# Patient Record
Sex: Female | Born: 1990 | Race: Black or African American | Hispanic: No | Marital: Single | Smoking: Never smoker
Health system: Southern US, Community
[De-identification: ages and names within clinical notes are randomized; demographics above are authoritative.]

## PROBLEM LIST (undated history)

## (undated) DIAGNOSIS — J45909 Unspecified asthma, uncomplicated: Secondary | ICD-10-CM

## (undated) DIAGNOSIS — R011 Cardiac murmur, unspecified: Secondary | ICD-10-CM

## (undated) HISTORY — PX: ABDOMINAL SURGERY: SHX537

---

## 2013-10-01 ENCOUNTER — Emergency Department: Payer: Self-pay | Admitting: Internal Medicine

## 2019-04-04 ENCOUNTER — Emergency Department
Admission: EM | Admit: 2019-04-04 | Discharge: 2019-04-04 | Disposition: A | Discharge status: Home / Self Care | Payer: No Typology Code available for payment source | Attending: Emergency Medicine | Admitting: Emergency Medicine

## 2019-04-04 ENCOUNTER — Other Ambulatory Visit: Payer: Self-pay

## 2019-04-04 ENCOUNTER — Emergency Department: Payer: No Typology Code available for payment source

## 2019-04-04 DIAGNOSIS — S161XXA Strain of muscle, fascia and tendon at neck level, initial encounter: Secondary | ICD-10-CM | POA: Diagnosis not present

## 2019-04-04 DIAGNOSIS — Y929 Unspecified place or not applicable: Secondary | ICD-10-CM | POA: Insufficient documentation

## 2019-04-04 DIAGNOSIS — S199XXA Unspecified injury of neck, initial encounter: Secondary | ICD-10-CM | POA: Diagnosis present

## 2019-04-04 DIAGNOSIS — J45909 Unspecified asthma, uncomplicated: Secondary | ICD-10-CM | POA: Diagnosis not present

## 2019-04-04 DIAGNOSIS — Y999 Unspecified external cause status: Secondary | ICD-10-CM | POA: Diagnosis not present

## 2019-04-04 DIAGNOSIS — Y939 Activity, unspecified: Secondary | ICD-10-CM | POA: Diagnosis not present

## 2019-04-04 HISTORY — DX: Unspecified asthma, uncomplicated: J45.909

## 2019-04-04 MED ORDER — CYCLOBENZAPRINE HCL 5 MG PO TABS: 5.0000 mg | ORAL_TABLET | Freq: Three times a day (TID) | ORAL | 0 refills | Status: DC | PRN

## 2019-04-04 MED ORDER — CYCLOBENZAPRINE HCL 10 MG PO TABS
10.0000 mg | ORAL_TABLET | Freq: Once | ORAL | Status: AC
  Administered 2019-04-04: 10 mg via ORAL
  Filled 2019-04-04: qty 1

## 2019-04-04 MED ORDER — ACETAMINOPHEN 325 MG PO TABS
650.0000 mg | ORAL_TABLET | Freq: Once | ORAL | Status: AC
  Administered 2019-04-04: 650 mg via ORAL
  Filled 2019-04-04: qty 2
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
## 2019-08-31 ENCOUNTER — Encounter: Payer: Self-pay | Admitting: Emergency Medicine

## 2019-08-31 ENCOUNTER — Other Ambulatory Visit: Payer: Self-pay

## 2019-08-31 ENCOUNTER — Emergency Department: Payer: No Typology Code available for payment source

## 2019-08-31 ENCOUNTER — Emergency Department
Admission: EM | Admit: 2019-08-31 | Discharge: 2019-08-31 | Disposition: A | Discharge status: Home / Self Care | Payer: No Typology Code available for payment source | Attending: Emergency Medicine | Admitting: Emergency Medicine

## 2019-08-31 DIAGNOSIS — S60222A Contusion of left hand, initial encounter: Secondary | ICD-10-CM | POA: Diagnosis not present

## 2019-08-31 DIAGNOSIS — Y9289 Other specified places as the place of occurrence of the external cause: Secondary | ICD-10-CM | POA: Insufficient documentation

## 2019-08-31 DIAGNOSIS — Y9389 Activity, other specified: Secondary | ICD-10-CM | POA: Insufficient documentation

## 2019-08-31 DIAGNOSIS — J45909 Unspecified asthma, uncomplicated: Secondary | ICD-10-CM | POA: Diagnosis not present

## 2019-08-31 DIAGNOSIS — Y990 Civilian activity done for income or pay: Secondary | ICD-10-CM | POA: Insufficient documentation

## 2019-08-31 DIAGNOSIS — W230XXA Caught, crushed, jammed, or pinched between moving objects, initial encounter: Secondary | ICD-10-CM | POA: Insufficient documentation

## 2019-08-31 DIAGNOSIS — S6992XA Unspecified injury of left wrist, hand and finger(s), initial encounter: Secondary | ICD-10-CM | POA: Diagnosis present

## 2019-08-31 MED ORDER — NAPROXEN 500 MG PO TABS: 500.0000 mg | ORAL_TABLET | Freq: Two times a day (BID) | ORAL | Status: DC

## 2019-08-31 NOTE — Discharge Instructions (Addendum)
Follow discharge care instruction take medication as directed. °

## 2019-08-31 NOTE — ED Provider Notes (Signed)
[facility] Emergency Department Provider Note   ____________________________________________   First MD Initiated Contact with Patient 08/31/19 1331     (approximate)  I have reviewed the triage vital signs and the nursing notes.   HISTORY  Chief Complaint Hand Injury    HPI Teresa Lynn is a 28 y.o. female patient complaining of a crush injury to left hand at work 2 days ago.  Patient hand was caught between rollers at work.  Patient the pain increases with flexion of the digits.  Patient rates the pain 6/10.  Patient described the pain is "achy".  No palliative measure for complaint.         Past Medical History:  Diagnosis Date  . Asthma     There are no active problems to display for this patient.   History reviewed. No pertinent surgical history.  Prior to Admission medications   Medication Sig Start Date End Date Taking? Authorizing Provider  naproxen (NAPROSYN) 500 MG tablet Take 1 tablet (500 mg total) by mouth 2 (two) times daily with a meal. 08/31/19   Joni Reining, PA-C    Allergies Other and Mango butter  No family history on file.  Social History Social History   Tobacco Use  . Smoking status: Never Smoker  . Smokeless tobacco: Never Used  Substance Use Topics  . Alcohol use: Not Currently  . Drug use: Not on file    Review of Systems Constitutional: No fever/chills Eyes: No visual changes. ENT: No sore throat. Cardiovascular: Denies chest pain. Respiratory: Denies shortness of breath. Gastrointestinal: No abdominal pain.  No nausea, no vomiting.  No diarrhea.  No constipation. Genitourinary: Negative for dysuria. Musculoskeletal: Left hand pain. Skin: Negative for rash. Neurological: Negative for headaches, focal weakness or numbness. Allergic/Immunilogical: Mango butter ____________________________________________   PHYSICAL EXAM:  VITAL SIGNS: ED Triage Vitals  Enc Vitals Group     BP  08/31/19 1312 (!) 112/46     Pulse Rate 08/31/19 1312 89     Resp 08/31/19 1312 16     Temp 08/31/19 1314 97.8 F (36.6 C)     Temp Source 08/31/19 1312 Oral     SpO2 08/31/19 1312 100 %     Weight --      Height --      Head Circumference --      Peak Flow --      Pain Score 08/31/19 1315 6     Pain Loc --      Pain Edu? --      Excl. in GC? --    Constitutional: Alert and oriented. Well appearing and in no acute distress. Cardiovascular: Normal rate, regular rhythm. Grossly normal heart sounds.  Good peripheral circulation. Respiratory: Normal respiratory effort.  No retractions. Lungs CTAB. Gastrointestinal: Soft and nontender. No distention. No abdominal bruits. No CVA tenderness. Musculoskeletal: No deformity to the left hand.  No edema.  Patient is moderate guarding palpation at the second through the fourth metacarpal.  Patient has full digital range of motion.  Neurologic:  Normal speech and language. No gross focal neurologic deficits are appreciated. No gait instability. Skin:  Skin is warm, dry and intact. No rash noted.  No abrasions or ecchymosis. Psychiatric: Mood and affect are normal. Speech and behavior are normal.  ____________________________________________   LABS (all labs ordered are listed, but only abnormal results are displayed)  Labs Reviewed - No data to display ____________________________________________  EKG   ____________________________________________  RADIOLOGY  ED MD interpretation:    Official radiology report(s): Dg Hand Complete Left  Result Date: 08/31/2019 CLINICAL DATA:  Left hand pain after injury. EXAM: LEFT HAND - COMPLETE 3+ VIEW COMPARISON:  None. FINDINGS: There is no evidence of fracture or dislocation. There is no evidence of arthropathy or other focal bone abnormality. Soft tissues are unremarkable. IMPRESSION: Negative. Electronically Signed   By: Davina Poke M.D.   On: 08/31/2019 13:45     ____________________________________________   PROCEDURES  Procedure(s) performed (including Critical Care):  Procedures   ____________________________________________   INITIAL IMPRESSION / ASSESSMENT AND PLAN / ED COURSE  As part of my medical decision making, I reviewed the following data within the [place] was evaluated in Emergency Department on 08/31/2019 for the symptoms described in the history of present illness. She was evaluated in the context of the global COVID-19 pandemic, which necessitated consideration that the patient might be at risk for infection with the SARS-CoV-2 virus that causes COVID-19. Institutional protocols and algorithms that pertain to the evaluation of patients at risk for COVID-19 are in a state of rapid change based on information released by regulatory bodies including the CDC and federal and state organizations. These policies and algorithms were followed during the patient's care in the ED.  Patient presents with left hand pain secondary to crush incident at work 2 days ago.  Physical exam was grossly unremarkable.  Discussed negative x-ray findings with patient.  Patient physical exam is consistent with contusion of the left hand.  Patient given discharge care instruction.  Patient hand was Ace wrapped prior to departure.  Take medication as directed.  Advised [facility] condition persist.      ____________________________________________   FINAL CLINICAL IMPRESSION(S) / ED DIAGNOSES  Final diagnoses:  Contusion of left hand, initial encounter     ED Discharge Orders         Ordered    naproxen (NAPROSYN) 500 MG tablet  2 times daily with meals     08/31/19 1343           Note:  This document was prepared using Dragon voice recognition software and may include unintentional dictation errors.    Sable Feil, PA-C 08/31/19 1349    Blake Divine, MD  08/31/19 [phone]

## 2019-08-31 NOTE — ED Notes (Signed)
See triage note. Presents with pain to left hand   States she had her hand caught between box and rail

## 2019-08-31 NOTE — ED Triage Notes (Signed)
Pt reports injury to left hand Tuesday at work. No obvious deformity

## 2019-10-21 ENCOUNTER — Ambulatory Visit
Admission: EM | Admit: 2019-10-21 | Discharge: 2019-10-21 | Disposition: A | Discharge status: Home / Self Care | Payer: HRSA Program | Attending: Family Medicine | Admitting: Family Medicine

## 2019-10-21 ENCOUNTER — Encounter: Payer: Self-pay | Admitting: Emergency Medicine

## 2019-10-21 ENCOUNTER — Other Ambulatory Visit: Payer: Self-pay

## 2019-10-21 DIAGNOSIS — U071 COVID-19: Secondary | ICD-10-CM | POA: Insufficient documentation

## 2019-10-21 DIAGNOSIS — J3489 Other specified disorders of nose and nasal sinuses: Secondary | ICD-10-CM

## 2019-10-21 DIAGNOSIS — Z79899 Other long term (current) drug therapy: Secondary | ICD-10-CM | POA: Insufficient documentation

## 2019-10-21 DIAGNOSIS — J45909 Unspecified asthma, uncomplicated: Secondary | ICD-10-CM | POA: Diagnosis not present

## 2019-10-21 MED ORDER — IPRATROPIUM BROMIDE 0.06 % NA SOLN: 2.0000 | Freq: Four times a day (QID) | NASAL | 0 refills | Status: AC | PRN

## 2019-10-21 MED ORDER — CETIRIZINE-PSEUDOEPHEDRINE ER 5-120 MG PO TB12: 1.0000 | ORAL_TABLET | Freq: Two times a day (BID) | ORAL | 0 refills | Status: AC

## 2019-10-21 NOTE — ED Provider Notes (Signed)
[facility]    CSN: [id number] Arrival date & time: 10/21/19  1215      History   Chief Complaint Chief Complaint  Patient presents with  . Sinus Problem   HPI  28 year old female presents with the above complaint.  Patient reports that her symptoms started abruptly this morning.  She states that she was at work and around 430 to 5 AM she had some floaters in her right eye.  She subsequent developed pain and pressure behind and around the right eye.  Rates her pain as 4/10 in severity.  No fever.  No medications or interventions tried.  Patient is concerned that she has a sinus infection.  No reported sick contacts.  No known exacerbating relieving factors.  No other complaints.  PMH, Surgical Hx, Family Hx, Social History reviewed and updated as below.  Past Medical History:  Diagnosis Date  . Asthma    Past Surgical History:  Procedure Laterality Date  . ABDOMINAL SURGERY      OB History   No obstetric history on file.      Home Medications    Prior to Admission medications   Medication Sig Start Date End Date Taking? Authorizing Provider  cetirizine-pseudoephedrine (ZYRTEC-D) 5-120 MG tablet Take 1 tablet by mouth 2 (two) times daily. 10/21/19   Everlene Other G, DO  ipratropium (ATROVENT) 0.06 % nasal spray Place 2 sprays into both nostrils 4 (four) times daily as needed for rhinitis. 10/21/19   Tommie Sams, DO    Family History Family History  Problem Relation Age of Onset  . Hypertension Mother   . Healthy Father     Social History Social History   Tobacco Use  . Smoking status: Never Smoker  . Smokeless tobacco: Never Used  Substance Use Topics  . Alcohol use: Not Currently  . Drug use: Not on file     Allergies   Other and Mango butter   Review of Systems Review of Systems  Constitutional: Negative for fever.  HENT:       Pain around/behind Right eye.    Physical Exam Triage Vital Signs ED Triage Vitals  Enc Vitals Group      BP 10/21/19 1234 (!) 106/91     Pulse Rate 10/21/19 1234 88     Resp 10/21/19 1234 14     Temp 10/21/19 1234 98.2 F (36.8 C)     Temp Source 10/21/19 1234 Oral     SpO2 10/21/19 1234 99 %     Weight 10/21/19 1228 185 lb (83.9 kg)     Height 10/21/19 1228 5\' 9"  (1.753 m)     Head Circumference --      Peak Flow --      Pain Score 10/21/19 1227 4     Pain Loc --      Pain Edu? --      Excl. in GC? --    Updated Vital Signs BP (!) 106/91 (BP Location: Right Arm)   Pulse 88   Temp 98.2 F (36.8 C) (Oral)   Resp 14   Ht 5\' 9"  (1.753 m)   Wt 83.9 kg   LMP 09/30/2019 (Approximate)   SpO2 99%   BMI 27.32 kg/m   Visual Acuity Right Eye Distance:   Left Eye Distance:   Bilateral Distance:    Right Eye Near:   Left Eye Near:    Bilateral Near:     Physical Exam Vitals signs and nursing note reviewed.  Constitutional:      General: She is not in acute distress.    Appearance: Normal appearance. She is not ill-appearing.  HENT:     Head: Normocephalic and atraumatic.     Nose: Nose normal.  Eyes:     General:        Right eye: No discharge.        Left eye: No discharge.     Conjunctiva/sclera: Conjunctivae normal.  Cardiovascular:     Rate and Rhythm: Normal rate and regular rhythm.     Heart sounds: No murmur.  Pulmonary:     Effort: Pulmonary effort is normal.     Breath sounds: Normal breath sounds. No wheezing, rhonchi or rales.  Skin:    General: Skin is warm.     Findings: No rash.  Neurological:     Mental Status: She is alert.  Psychiatric:        Mood and Affect: Mood normal.        Behavior: Behavior normal.    UC Treatments / Results  Labs (all labs ordered are listed, but only abnormal results are displayed) Labs Reviewed  NOVEL CORONAVIRUS, NAA (HOSP ORDER, SEND-OUT TO REF LAB; TAT 18-24 HRS)    EKG   Radiology No results found.  Procedures Procedures (including critical care time)  Medications Ordered in UC Medications - No  data to display  Initial Impression / Assessment and Plan / UC Course  I have reviewed the triage vital signs and the nursing notes.  Pertinent labs & imaging results that were available during my care of the patient were reviewed by me and considered in my medical decision making (see chart for details).    28 year old female presents with sinus pressure.  Symptoms just started.  There is no evidence of bacterial infection.  Awaiting Covid test results.  Zyrtec-D and Atrovent nasal spray as directed.  Work note given.  Final Clinical Impressions(s) / UC Diagnoses   Final diagnoses:  Sinus pressure     Discharge Instructions     Medication as directed.  Covid test results available in 24 to 48 h.  Take care  Dr. Lacinda Axon    ED Prescriptions    Medication Sig Dispense Auth. Provider   cetirizine-pseudoephedrine (ZYRTEC-D) 5-120 MG tablet Take 1 tablet by mouth 2 (two) times daily. 30 tablet Jaxston Chohan G, DO   ipratropium (ATROVENT) 0.06 % nasal spray Place 2 sprays into both nostrils 4 (four) times daily as needed for rhinitis. 15 mL Coral Spikes, DO     PDMP not reviewed this encounter.   Coral Spikes, DO 10/21/19 1324

## 2019-10-21 NOTE — ED Triage Notes (Signed)
Patient c/o sinus pain and pressure that started this morning while at work.  Patient c/o pain behind her right eye.  Patient denies fevers.

## 2019-10-21 NOTE — Discharge Instructions (Signed)
Medication as directed.  Covid test results available in 24 to 48 h.  Take care  Dr. Lacinda Axon

## 2019-10-23 ENCOUNTER — Telehealth (HOSPITAL_COMMUNITY): Payer: Self-pay | Admitting: Emergency Medicine

## 2019-10-23 NOTE — Telephone Encounter (Signed)
Your test for COVID-19 was positive, meaning that you were infected with the novel coronavirus and could give the germ to others.  Please continue isolation at home for at least 10 days since the start of your symptoms. If you do not have symptoms, please isolate at home for 10 days from the day you were tested. Once you complete your 10 day quarantine, you may return to normal activities as long as you've not had a fever for over 24 hours(without taking fever reducing medicine) and your symptoms are improving. Please continue good preventive care measures, including:  frequent hand-washing, avoid touching your face, cover coughs/sneezes, stay out of crowds and keep a 6 foot distance from others.  Go to the nearest hospital emergency room if fever/cough/breathlessness are severe or illness seems like a threat to life.  Patient contacted by phone and made aware of    results. Pt verbalized understanding and had all questions answered. Work notes sent to [employer].

## 2019-10-24 LAB — NOVEL CORONAVIRUS, NAA (HOSP ORDER, SEND-OUT TO REF LAB; TAT 18-24 HRS): SARS-CoV-2, NAA: DETECTED — AB

## 2020-02-14 ENCOUNTER — Other Ambulatory Visit: Payer: Self-pay

## 2020-02-14 ENCOUNTER — Emergency Department
Admission: EM | Admit: 2020-02-14 | Discharge: 2020-02-14 | Disposition: A | Discharge status: Home / Self Care | Payer: Managed Care, Other (non HMO) | Attending: Emergency Medicine | Admitting: Emergency Medicine

## 2020-02-14 ENCOUNTER — Encounter: Payer: Self-pay | Admitting: Emergency Medicine

## 2020-02-14 ENCOUNTER — Ambulatory Visit
Admission: EM | Admit: 2020-02-14 | Discharge: 2020-02-14 | Disposition: A | Discharge status: Other Acute Inpatient Hospital | Payer: Managed Care, Other (non HMO)

## 2020-02-14 ENCOUNTER — Emergency Department: Payer: Managed Care, Other (non HMO)

## 2020-02-14 DIAGNOSIS — I951 Orthostatic hypotension: Secondary | ICD-10-CM | POA: Diagnosis not present

## 2020-02-14 DIAGNOSIS — D649 Anemia, unspecified: Secondary | ICD-10-CM | POA: Insufficient documentation

## 2020-02-14 DIAGNOSIS — E860 Dehydration: Secondary | ICD-10-CM | POA: Insufficient documentation

## 2020-02-14 DIAGNOSIS — R55 Syncope and collapse: Secondary | ICD-10-CM

## 2020-02-14 HISTORY — DX: Cardiac murmur, unspecified: R01.1

## 2020-02-14 LAB — BASIC METABOLIC PANEL
Anion gap: 7 (ref 5–15)
BUN: 12 mg/dL (ref 6–20)
CO2: 30 mmol/L (ref 22–32)
Calcium: 9.3 mg/dL (ref 8.9–10.3)
Chloride: 101 mmol/L (ref 98–111)
Creatinine, Ser: 0.76 mg/dL (ref 0.44–1.00)
GFR calc Af Amer: >60 mL/min (ref >60)
GFR calc non Af Amer: >60 mL/min (ref >60)
Glucose, Bld: 99 mg/dL (ref 70–99)
Potassium: 4.2 mmol/L (ref 3.5–5.1)
Sodium: 138 mmol/L (ref 135–145)

## 2020-02-14 LAB — CBC
HCT: 34.9 % — ABNORMAL LOW (ref 36.0–46.0)
Hemoglobin: 11.4 g/dL — ABNORMAL LOW (ref 12.0–15.0)
MCH: 27.1 pg (ref 26.0–34.0)
MCHC: 32.7 g/dL (ref 30.0–36.0)
MCV: 82.9 fL (ref 80.0–100.0)
Platelets: 327 10*3/uL (ref 150–400)
RBC: 4.21 MIL/uL (ref 3.87–5.11)
RDW: 15.1 % (ref 11.5–15.5)
WBC: 4.8 10*3/uL (ref 4.0–10.5)
nRBC: 0 % (ref 0.0–0.2)

## 2020-02-14 LAB — URINALYSIS, COMPLETE (UACMP) WITH MICROSCOPIC
Bacteria, UA: NONE SEEN
Bilirubin Urine: NEGATIVE
Glucose, UA: NEGATIVE mg/dL
Ketones, ur: NEGATIVE mg/dL
Leukocytes,Ua: NEGATIVE
Nitrite: NEGATIVE
Protein, ur: NEGATIVE mg/dL
Specific Gravity, Urine: 1.013 (ref 1.005–1.030)
pH: 7 (ref 5.0–8.0)

## 2020-02-14 LAB — POCT PREGNANCY, URINE: Preg Test, Ur: NEGATIVE

## 2020-02-14 LAB — TROPONIN I (HIGH SENSITIVITY): Troponin I (High Sensitivity): <2 ng/L (ref <18)

## 2020-02-14 MED ORDER — FERROUS SULFATE 134 MG PO TABS: 134.0000 mg | ORAL_TABLET | Freq: Every day | ORAL | 1 refills | Status: AC

## 2020-02-14 MED ORDER — DIPHENHYDRAMINE HCL 50 MG/ML IJ SOLN
25.0000 mg | Freq: Once | INTRAMUSCULAR | Status: AC
  Administered 2020-02-14: 25 mg via INTRAVENOUS
  Filled 2020-02-14: qty 1

## 2020-02-14 MED ORDER — SODIUM CHLORIDE 0.9% FLUSH: 3.0000 mL | Freq: Once | INTRAVENOUS | Status: DC

## 2020-02-14 MED ORDER — LACTATED RINGERS IV BOLUS
1000.0000 mL | Freq: Once | INTRAVENOUS | Status: AC
  Administered 2020-02-14: 1000 mL via INTRAVENOUS

## 2020-02-14 MED ORDER — PROCHLORPERAZINE EDISYLATE 10 MG/2ML IJ SOLN
10.0000 mg | Freq: Once | INTRAMUSCULAR | Status: AC
  Administered 2020-02-14: 10 mg via INTRAVENOUS
  Filled 2020-02-14: qty 2

## 2020-02-14 NOTE — ED Notes (Signed)
Pt transported to CT at this time.

## 2020-02-14 NOTE — ED Triage Notes (Signed)
First Nurse Note:  Arrives from Urgent Care for ED evaluation of headache and syncope.  Patient is AAOx3.  Skin warm and dry. NAD

## 2020-02-14 NOTE — ED Notes (Signed)
EDP at bedside to speak with patient at this time. Meds administered per EDP order.

## 2020-02-14 NOTE — ED Triage Notes (Signed)
Pt states she had a syncople episode on Saturday with a HA since, states today while at work she had another episode. Denies any N/V/D or other sx. Denies hx of syncope . Denies pregnancy.

## 2020-02-14 NOTE — Discharge Instructions (Addendum)
Drink at least 6-8 glasses of water daily for the next 2-3 days  Start taking your iron supplement again - this will help with your fatigue and energy  Be very careful and deliberate when going from sitting to standing

## 2020-02-14 NOTE — ED Provider Notes (Signed)
Roderic Palau    CSN: [id number] Arrival date & time: 02/14/20  1039      History   Chief Complaint Chief Complaint  Patient presents with  . Headache  . Passed Out    HPI Teresa Lynn is a 29 y.o. female.   Patient presents with report of syncopal episode at work on 02/10/2020 and again this morning while at work.  She states she was unconscious x5 to 10 minutes.  EMS was not called.  She reports a right side headache, 5/10, "throbbing"; no treatment attempted at home.  She denies weakness, numbness, bowel/bladder incontinence, chest pain, palpitations, shortness of breath, or other symptoms.  The history is provided by the patient.    Past Medical History:  Diagnosis Date  . Asthma   . Heart murmur     There are no problems to display for this patient.   Past Surgical History:  Procedure Laterality Date  . ABDOMINAL SURGERY      OB History   No obstetric history on file.      Home Medications    Prior to Admission medications   Medication Sig Start Date End Date Taking? Authorizing Provider  cetirizine-pseudoephedrine (ZYRTEC-D) 5-120 MG tablet Take 1 tablet by mouth 2 (two) times daily. 10/21/19   Thersa Salt G, DO  ipratropium (ATROVENT) 0.06 % nasal spray Place 2 sprays into both nostrils 4 (four) times daily as needed for rhinitis. 10/21/19   Coral Spikes, DO    Family History Family History  Problem Relation Age of Onset  . Hypertension Mother   . Healthy Father     Social History Social History   Tobacco Use  . Smoking status: Never Smoker  . Smokeless tobacco: Never Used  Substance Use Topics  . Alcohol use: Not Currently  . Drug use: Not on file     Allergies   Other and Mango butter   Review of Systems Review of Systems  Constitutional: Negative for chills and fever.  HENT: Negative for ear pain and sore throat.   Eyes: Negative for pain and visual disturbance.  Respiratory: Negative for cough and shortness of breath.    Cardiovascular: Negative for chest pain and palpitations.  Gastrointestinal: Negative for abdominal pain, constipation, diarrhea and vomiting.  Genitourinary: Negative for dysuria and hematuria.  Musculoskeletal: Negative for arthralgias and back pain.  Skin: Negative for color change and rash.  Neurological: Positive for syncope and headaches. Negative for dizziness, tremors, seizures, facial asymmetry, speech difficulty, weakness, light-headedness and numbness.  All other systems reviewed and are negative.    Physical Exam Triage Vital Signs ED Triage Vitals  Enc Vitals Group     BP      Pulse      Resp      Temp      Temp src      SpO2      Weight      Height      Head Circumference      Peak Flow      Pain Score      Pain Loc      Pain Edu?      Excl. in [place]?    No data found.  Updated Vital Signs BP 111/65 (BP Location: Left Arm)   Pulse 89   Temp 98.1 F (36.7 C) (Oral)   Resp 18   Wt 155 lb (70.3 kg)   LMP 01/31/2020   SpO2 98%   BMI 22.89 kg/m  Visual Acuity Right Eye Distance:   Left Eye Distance:   Bilateral Distance:    Right Eye Near:   Left Eye Near:    Bilateral Near:     Physical Exam Vitals and nursing note reviewed.  Constitutional:      General: She is not in acute distress.    Appearance: She is well-developed. She is not ill-appearing.  HENT:     Head: Normocephalic and atraumatic.     Right Ear: Tympanic membrane normal.     Left Ear: Tympanic membrane normal.     Nose: Nose normal.     Mouth/Throat:     Mouth: Mucous membranes are moist.     Pharynx: Oropharynx is clear.  Eyes:     Extraocular Movements: Extraocular movements intact.     Conjunctiva/sclera: Conjunctivae normal.     Pupils: Pupils are equal, round, and reactive to light.  Cardiovascular:     Rate and Rhythm: Normal rate and regular rhythm.     Heart sounds: Normal heart sounds. No murmur.  Pulmonary:     Effort: Pulmonary effort is normal. No respiratory  distress.     Breath sounds: Normal breath sounds.  Abdominal:     Palpations: Abdomen is soft.     Tenderness: There is no abdominal tenderness. There is no guarding or rebound.  Musculoskeletal:     Cervical back: Neck supple.     Right lower leg: No edema.     Left lower leg: No edema.  Skin:    General: Skin is warm and dry.     Findings: No rash.  Neurological:     General: No focal deficit present.     Mental Status: She is alert and oriented to person, place, and time.     Sensory: No sensory deficit.     Motor: No weakness.     Coordination: Romberg sign negative. Coordination normal.     Gait: Gait normal.  Psychiatric:        Mood and Affect: Mood normal.        Behavior: Behavior normal.      UC Treatments / Results  Labs (all labs ordered are listed, but only abnormal results are displayed) Labs Reviewed - No data to display  EKG   Radiology No results found.  Procedures Procedures (including critical care time)  Medications Ordered in UC Medications - No data to display  Initial Impression / Assessment and Plan / UC Course  I have reviewed the triage vital signs and the nursing notes.  Pertinent labs & imaging results that were available during my care of the patient were reviewed by me and considered in my medical decision making (see chart for details).   Syncopal episodes.  Discussed with patient the limitations of being seen here in the urgent care versus the emergency department.  Discussed that she may need diagnostic testing above what we can offer.  Patient is agreeable to this plan of care.  Patient declines additional work-up here (EKG, labs) and states she will go to the emergency department.  She declines transportation by EMS.  She drove herself here and reports she will drive herself to the emergency department.     Final Clinical Impressions(s) / UC Diagnoses   Final diagnoses:  Syncope, unspecified syncope type     Discharge  Instructions     Go to the emergency department for evaluation after losing consciousness this morning.        ED Prescriptions  None     PDMP not reviewed this encounter.   Mickie Bail, NP 02/14/20 [phone]

## 2020-02-14 NOTE — ED Provider Notes (Signed)
[facility] EMERGENCY DEPARTMENT Provider Note   CSN: [id number] Arrival date & time: 02/14/20  1142     History Chief Complaint  Patient presents with  . Loss of Consciousness    Teresa Lynn is a 29 y.o. female.  HPI   29 yo F with h/o heart murmur, asthma, here with syncopal episode. Pt states that Saturday, at work she began to feel lightheaded. She had been working hard and lifting boxes at her job (works @ [employer]). 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 She tried to brace herself but lost consciousness. Regained consciousness after falling. No loss of bowel or bladder function, no seizure activity. No CP, SOB, palpitations. Since then, she has felt fatigued and admits to persistent poor appetite and occasional lightheadedness with standing. No fevers, chills, or recent illness. She was at work today when she stood up to walk inside, felt lightheaded, and had another syncope episode. Regained consciousness shortly thereafter. She has had a mild, right-sided, aching and throbbing HA since then. No alleviating factors. No focal numbness or weakness.  Past Medical History:  Diagnosis Date  . Asthma   . Heart murmur     There are no problems to display for this patient.   Past Surgical History:  Procedure Laterality Date  . ABDOMINAL SURGERY       OB History   No obstetric history on file.     Family History  Problem Relation Age of Onset  . Hypertension Mother   . Healthy Father     Social History   Tobacco Use  . Smoking status: Never Smoker  . Smokeless tobacco: Never Used  Substance Use Topics  . Alcohol use: Not Currently  . Drug use: Not on file    Home Medications Prior to Admission medications   Medication Sig Start Date End Date Taking? Authorizing Provider  cetirizine-pseudoephedrine (ZYRTEC-D) 5-120 MG tablet Take 1 tablet by mouth 2 (two) times daily. 10/21/19   Tommie Sams, DO  ferrous  sulfate 134 MG TABS Take 1 tablet (134 mg total) by mouth daily with breakfast. 02/14/20 04/14/20  Shaune Pollack, MD  ipratropium (ATROVENT) 0.06 % nasal spray Place 2 sprays into both nostrils 4 (four) times daily as needed for rhinitis. 10/21/19   Tommie Sams, DO    Allergies    Other and Mango butter  Review of Systems   Review of Systems  Constitutional: Positive for fatigue. Negative for fever.  HENT: Negative for congestion and sore throat.   Eyes: Negative for visual disturbance.  Respiratory: Negative for cough and shortness of breath.   Cardiovascular: Negative for chest pain.  Gastrointestinal: Negative for abdominal pain, diarrhea, nausea and vomiting.  Genitourinary: Negative for flank pain.  Musculoskeletal: Negative for back pain and neck pain.  Skin: Negative for rash and wound.  Neurological: Positive for light-headedness and headaches. Negative for weakness.  All other systems reviewed and are negative.   Physical Exam Updated Vital Signs BP (!) 94/53   Pulse (!) 58   Temp 98 F (36.7 C)   Resp 18   Ht 5\' 9"  (1.753 m)   Wt 69.9 kg   LMP 01/31/2020   SpO2 100%   BMI 22.74 kg/m   Physical Exam Vitals and nursing note reviewed.  Constitutional:      General: She is not in acute distress.    Appearance: She is well-developed.  HENT:     Head: Normocephalic and  atraumatic.  Eyes:     Conjunctiva/sclera: Conjunctivae normal.  Cardiovascular:     Rate and Rhythm: Normal rate and regular rhythm.     Heart sounds: Normal heart sounds. No murmur. No friction rub.  Pulmonary:     Effort: Pulmonary effort is normal. No respiratory distress.     Breath sounds: Normal breath sounds. No wheezing or rales.  Abdominal:     General: There is no distension.     Palpations: Abdomen is soft.     Tenderness: There is no abdominal tenderness.  Musculoskeletal:     Cervical back: Neck supple.  Skin:    General: Skin is warm.     Capillary Refill: Capillary refill  takes less than 2 seconds.  Neurological:     Mental Status: She is alert and oriented to person, place, and time.     Motor: No abnormal muscle tone.     Comments: Neurological Exam:  Mental Status: Alert and oriented to person, place, and time. Attention and concentration normal. Speech clear. Recent memory is intact. Cranial Nerves: Visual fields grossly intact. EOMI and PERRLA. No nystagmus noted. Facial sensation intact at forehead, maxillary cheek, and chin/mandible bilaterally. No facial asymmetry or weakness. Hearing grossly normal. Uvula is midline, and palate elevates symmetrically. Normal SCM and trapezius strength. Tongue midline without fasciculations. Motor: Muscle strength 5/5 in proximal and distal UE and LE bilaterally. No pronator drift. Muscle tone normal.  Sensation: Intact to light touch in upper and lower extremities distally bilaterally.  Gait: Normal without ataxia. Coordination: Normal FTN bilaterally.        ED Results / Procedures / Treatments   Labs (all labs ordered are listed, but only abnormal results are displayed) Labs Reviewed  CBC - Abnormal; Notable for the following components:      Result Value   Hemoglobin 11.4 (*)    HCT 34.9 (*)    All other components within normal limits  URINALYSIS, COMPLETE (UACMP) WITH MICROSCOPIC - Abnormal; Notable for the following components:   Color, Urine YELLOW (*)    APPearance HAZY (*)    Hgb urine dipstick SMALL (*)    All other components within normal limits  BASIC METABOLIC PANEL  POC URINE PREG, ED  POCT PREGNANCY, URINE  TROPONIN I (HIGH SENSITIVITY)    EKG EKG Interpretation  Date/Time:  Wednesday February 14 2020 12:10:13 EDT Ventricular Rate:  81 PR Interval:  172 QRS Duration: 90 QT Interval:  362 QTC Calculation: 420 R Axis:   51 Text Interpretation: Normal sinus rhythm Normal ECG No previous ECGs available Confirmed by UNCONFIRMED, DOCTOR ([id number]), editor Fredric Mare, Tammy [phone]) on 02/14/2020  1:11:05 PM  Normal sinus rhythm, VR 81. PR 172, QRS 90, QTc 420. No acute St elevations or depressions. No ischemia or infarct.  Radiology DG Chest 2 View  Result Date: 02/14/2020 CLINICAL DATA:  Syncope with heart murmur EXAM: CHEST - 2 VIEW COMPARISON:  None. FINDINGS: The lungs are clear. Heart size and pulmonary vascularity are within normal limits. No adenopathy. No bone lesions. IMPRESSION: Lungs clear.  Cardiac silhouette within normal limits. Electronically Signed   By: Bretta Bang III M.D.   On: 02/14/2020 13:54   CT Head Wo Contrast  Result Date: 02/14/2020 CLINICAL DATA:  Headache, acute, normal neuro exam. Additional history provided: Patient presents with reported syncopal episode at work on 02/10/2020 and again this morning, reportedly unconscious for 5-10 minutes. The patient reports a right-sided headache, 5/10, throbbing in quality, EXAM: CT HEAD WITHOUT  CONTRAST TECHNIQUE: Contiguous axial images were obtained from the base of the skull through the vertex without intravenous contrast. COMPARISON:  No pertinent prior studies available for comparison. FINDINGS: Brain: There is no evidence of acute intracranial hemorrhage, intracranial mass, midline shift or extra-axial fluid collection.No demarcated cortical infarction. Asymmetry of the lateral ventricles with the right slightly more prominent than the left, likely developmental. Vascular: No hyperdense vessel. Skull: Normal. Negative for fracture or focal lesion. Sinuses/Orbits: Visualized orbits demonstrate no acute abnormality. No significant paranasal sinus disease or mastoid effusion at the imaged levels. IMPRESSION: No evidence of acute intracranial abnormality. Asymmetry of the lateral ventricles as described and likely developmental. Electronically Signed   By: Kellie Simmering DO   On: 02/14/2020 13:44    Procedures .1-3 Lead EKG Interpretation Performed by: Duffy Bruce, MD Authorized by: Duffy Bruce, MD      Interpretation: normal     ECG rate:  50-70   ECG rate assessment: normal     Rhythm: sinus rhythm     Ectopy: none     Conduction: normal   Comments:     Indication: Syncope   (including critical care time)  Medications Ordered in ED Medications  sodium chloride flush (NS) 0.9 % injection 3 mL (3 mLs Intravenous Not Given 02/14/20 1256)  lactated ringers bolus 1,000 mL (0 mLs Intravenous Stopped 02/14/20 1652)  prochlorperazine (COMPAZINE) injection 10 mg (10 mg Intravenous Given 02/14/20 1428)  diphenhydrAMINE (BENADRYL) injection 25 mg (25 mg Intravenous Given 02/14/20 1429)    ED Course  I have reviewed the triage vital signs and the nursing notes.  Pertinent labs & imaging results that were available during my care of the patient were reviewed by me and considered in my medical decision making (see chart for details).  Clinical Course as of Feb 13 1700  Wed Feb 14, 2020  1419 28 yo F here with syncopal episode x 2. Suspect combination of dehydration and orthostasis in setting of poor appetite, increased work lately. EKG is normal, no signs of prolonged QT, WPW, or other arrhythmia and telemetry shows no ectopy. Do not suspect cardiogenic etiology. No seizure like activity and neuro exam is non focal. Labs are overall reassuring. Pt significantly orthostatic here. Will give fluids, reassess. I suspect her HA is 2/2 her mild dehydration and possible minor head trauma from the syncopal episode. HA was not thunderclap or severe in onset, CT head neg, do not suspect SAH.   [CI]  1449 Feels improved after fluids, is ambulatory without difficulty. HA improved. D/c with encouraged fluids, refill on her iron supplements, and d/c home.   [CI]    Clinical Course User Index [CI] Duffy Bruce, MD   MDM Rules/Calculators/A&P                      As above. Likely orthostasis and dehydration with underlying iron-deficiency. No high risk features. No known structural heart disease.  Final  Clinical Impression(s) / ED Diagnoses Final diagnoses:  Orthostasis  Dehydration  Chronic anemia    Rx / DC Orders ED Discharge Orders         Ordered    ferrous sulfate 134 MG TABS  Daily with breakfast     02/14/20 1458           Duffy Bruce, MD 02/14/20 1701

## 2020-02-14 NOTE — ED Triage Notes (Signed)
Patient stated that since Saturday has had a headache and has passed out several times at work  ZSM:OLMB  Denies: N/V, diarrhea

## 2020-02-14 NOTE — Discharge Instructions (Addendum)
Go to the emergency department for evaluation after losing consciousness this morning.

## 2020-10-21 ENCOUNTER — Other Ambulatory Visit: Payer: Self-pay

## 2020-10-21 ENCOUNTER — Encounter: Payer: Self-pay | Admitting: Emergency Medicine

## 2020-10-21 ENCOUNTER — Ambulatory Visit
Admission: EM | Admit: 2020-10-21 | Discharge: 2020-10-21 | Disposition: A | Discharge status: Home / Self Care | Payer: Managed Care, Other (non HMO)

## 2020-10-21 DIAGNOSIS — L299 Pruritus, unspecified: Secondary | ICD-10-CM | POA: Diagnosis not present

## 2020-10-21 DIAGNOSIS — L509 Urticaria, unspecified: Secondary | ICD-10-CM | POA: Diagnosis not present

## 2020-10-21 MED ORDER — PREDNISONE 10 MG PO TABS: ORAL_TABLET | ORAL | 0 refills | Status: AC

## 2020-10-21 NOTE — ED Provider Notes (Signed)
[facility]    CSN: [id number] Arrival date & time: 10/21/20  1151      History   Chief Complaint Chief Complaint  Patient presents with  . Rash  . Pruritis    HPI Teresa Lynn is a 29 y.o. female presenting for onset of itchy rash in the middle the night last night.  Patient states that she woke up from her sleep itching.  He says that she had swelling of the right side of her face with associated hives-like rash on her face, lower back and neck.  She has been taking the Benadryl and Allegra and states that has helped, but she has gotten some new hives on her lower back today.  Patient denies contact with any known allergens.  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  She admits to eating a pasta dish at a new [place] last night.  Denies taking any new medications or using any new detergents, soaps, or body washes/lotions.  She denies any throat tightness, chest tightness or breathing difficulty.  Past medical history is significant for asthma.  No other complaints or concerns at this time.  HPI  Past Medical History:  Diagnosis Date  . Asthma   . Heart murmur     There are no problems to display for this patient.   Past Surgical History:  Procedure Laterality Date  . ABDOMINAL SURGERY      OB History   No obstetric history on file.      Home Medications    Prior to Admission medications   Medication Sig Start Date End Date Taking? Authorizing Provider  cetirizine-pseudoephedrine (ZYRTEC-D) 5-120 MG tablet Take 1 tablet by mouth 2 (two) times daily. 10/21/19  Yes Everlene Other G, DO  Multiple Vitamin (MULTIVITAMIN) tablet Take 1 tablet by mouth daily.   Yes [provider]  ferrous sulfate 134 MG TABS Take 1 tablet (134 mg total) by mouth daily with breakfast. 02/14/20 04/14/20  Shaune Pollack, MD  ipratropium (ATROVENT) 0.06 % nasal spray Place 2 sprays into both nostrils 4 (four) times daily as needed for rhinitis. 10/21/19   Tommie Sams, DO  predniSONE (DELTASONE) 10 MG tablet Take 6 tablets p.o. on the first day and decrease by 1 tablet daily x5 days 10/21/20   Gareth Morgan    Family History Family History  Problem Relation Age of Onset  . Hypertension Mother   . Healthy Father     Social History Social History   Tobacco Use  . Smoking status: Never Smoker  . Smokeless tobacco: Never Used  Vaping Use  . Vaping Use: Never used  Substance Use Topics  . Alcohol use: Not Currently  . Drug use: Not Currently     Allergies   Other and Mango butter   Review of Systems Review of Systems  Constitutional: Negative for fatigue and fever.  HENT: Positive for facial swelling. Negative for trouble swallowing and voice change.   Respiratory: Negative for chest tightness, shortness of breath and wheezing.   Musculoskeletal: Negative for myalgias.  Skin: Positive for rash.  Neurological: Negative for dizziness, weakness and headaches.     Physical Exam Triage Vital Signs ED Triage Vitals  Enc Vitals Group     BP 10/21/20 1243 119/80  Pulse Rate 10/21/20 1243 78     Resp 10/21/20 1243 18     Temp 10/21/20 1243 98.7 F (37.1 C)     Temp Source 10/21/20 1243 Oral     SpO2 10/21/20 1243 100 %     Weight 10/21/20 1240 154 lb 1.6 oz (69.9 kg)     Height 10/21/20 1240 5\' 9"  (1.753 m)     Head Circumference --      Peak Flow --      Pain Score 10/21/20 1240 0     Pain Loc --      Pain Edu? --      Excl. in [place]? --    No data found.  Updated Vital Signs BP 119/80 (BP Location: Left Arm)   Pulse 78   Temp 98.7 F (37.1 C) (Oral)   Resp 18   Ht 5\' 9"  (1.753 m)   Wt 154 lb 1.6 oz (69.9 kg)   LMP 10/20/2020   SpO2 100%   BMI 22.76 kg/m       Physical Exam Vitals and nursing note reviewed.  Constitutional:      General: She is not in  acute distress.    Appearance: Normal appearance. She is not ill-appearing or toxic-appearing.  HENT:     Head: Normocephalic and atraumatic.     Nose: Nose normal.     Mouth/Throat:     Mouth: Mucous membranes are moist.     Pharynx: Oropharynx is clear.  Eyes:     General: No scleral icterus.       Right eye: No discharge.        Left eye: No discharge.     Conjunctiva/sclera: Conjunctivae normal.  Cardiovascular:     Rate and Rhythm: Normal rate and regular rhythm.     Heart sounds: Normal heart sounds.  Pulmonary:     Effort: Pulmonary effort is normal. No respiratory distress.     Breath sounds: Normal breath sounds. No wheezing, rhonchi or rales.  Musculoskeletal:     Cervical back: Neck supple.  Skin:    General: Skin is dry.     Findings: Rash (erythematous wheals scattered throughout lower back, right neck and a few on right side of face) present.  Neurological:     General: No focal deficit present.     Mental Status: She is alert. Mental status is at baseline.     Motor: No weakness.     Gait: Gait normal.  Psychiatric:        Mood and Affect: Mood normal.        Behavior: Behavior normal.        Thought Content: Thought content normal.      UC Treatments / Results  Labs (all labs ordered are listed, but only abnormal results are displayed) Labs Reviewed - No data to display  EKG   Radiology No results found.  Procedures Procedures (including critical care time)  Medications Ordered in UC Medications - No data to display  Initial Impression / Assessment and Plan / UC Course  I have reviewed the triage vital signs and the nursing notes.  Pertinent labs & imaging results that were available during my care of the patient were reviewed by me and considered in my medical decision making (see chart for details).    Advised patient to continue Allegra.  Started her on prednisone since she is having some spreading of the rash.  Advised that she likely  came into contact with  any spice that she may be allergic to with her food last night.  Advised to consider allergy testing in the future.  ED precautions reviewed with patient.  Final Clinical Impressions(s) / UC Diagnoses   Final diagnoses:  Urticaria  Pruritus     Discharge Instructions     Continue Allegra.  Start prednisone.  Try not to scratch.  Keep skin moisturized with Aveeno or Eucerin.  Try to avoid any allergens.  It is quite likely that you came into contact with the spice the amount of an allergic to with your dinner last night.  Call EMS or go to ER if you have significant facial swelling, throat tightness, chest tightness or breathing difficulty.   ED Prescriptions    Medication Sig Dispense Auth. Provider   predniSONE (DELTASONE) 10 MG tablet Take 6 tablets p.o. on the first day and decrease by 1 tablet daily x5 days 21 tablet Shirlee Latch, PA-C     PDMP not reviewed this encounter.   Shirlee Latch, PA-C 10/21/20 1320

## 2020-10-21 NOTE — Discharge Instructions (Addendum)
Continue Allegra.  Start prednisone.  Try not to scratch.  Keep skin moisturized with Aveeno or Eucerin.  Try to avoid any allergens.  It is quite likely that you came into contact with the spice the amount of an allergic to with your dinner last night.  Call EMS or go to ER if you have significant facial swelling, throat tightness, chest tightness or breathing difficulty.

## 2020-10-21 NOTE — ED Triage Notes (Addendum)
Pt c/o rash on her back and face, and itching. Started last night in the middle of the night. She has been taking benadryl. Denies shortness of or any breathing difficulty. Denies change in detergents, soap, food or medications.

## 2020-11-20 ENCOUNTER — Other Ambulatory Visit: Payer: Self-pay

## 2020-11-20 ENCOUNTER — Ambulatory Visit
Admission: EM | Admit: 2020-11-20 | Discharge: 2020-11-20 | Disposition: A | Discharge status: Home / Self Care | Payer: No Typology Code available for payment source

## 2020-11-20 ENCOUNTER — Ambulatory Visit
Admission: EM | Admit: 2020-11-20 | Discharge: 2020-11-20 | Disposition: A | Discharge status: Home / Self Care | Payer: Managed Care, Other (non HMO) | Attending: Sports Medicine | Admitting: Sports Medicine

## 2020-11-20 ENCOUNTER — Encounter: Payer: Self-pay | Admitting: Emergency Medicine

## 2020-11-20 DIAGNOSIS — J3489 Other specified disorders of nose and nasal sinuses: Secondary | ICD-10-CM | POA: Diagnosis not present

## 2020-11-20 DIAGNOSIS — U071 COVID-19: Secondary | ICD-10-CM | POA: Insufficient documentation

## 2020-11-20 DIAGNOSIS — J069 Acute upper respiratory infection, unspecified: Secondary | ICD-10-CM

## 2020-11-20 DIAGNOSIS — R059 Cough, unspecified: Secondary | ICD-10-CM | POA: Diagnosis not present

## 2020-11-20 MED ORDER — BENZONATATE 100 MG PO CAPS: 100.0000 mg | ORAL_CAPSULE | Freq: Three times a day (TID) | ORAL | 0 refills | Status: AC

## 2020-11-20 NOTE — ED Triage Notes (Signed)
Patient c/o cough and nasal congestion that started 4 days ago.

## 2020-11-20 NOTE — ED Provider Notes (Addendum)
[facility]    CSN: [id number] Arrival date & time: 11/20/20  1437      History   Chief Complaint Chief Complaint  Patient presents with  . Cough  . Nasal Congestion    HPI Teresa Lynn is a 30 y.o. female.   Patient pleasant 30 year old female who presents for evaluation of about 5 days of sinus pressure congestion and cough.  She is also complaining of some head pressure.  She works at [employer].  She denies any fever shakes chills nausea vomiting or diarrhea.  No sore throat chest pain or shortness of breath.  She has not been vaccinated against Covid or influenza.  She has no Covid exposure that she is aware of.  She did have Covid back in December 2020.  No red flag signs or symptoms offered by the patient during history.     Past Medical History:  Diagnosis Date  . Asthma   . Heart murmur     There are no problems to display for this patient.   Past Surgical History:  Procedure Laterality Date  . ABDOMINAL SURGERY      OB History   No obstetric history on file.      Home Medications    Prior to Admission medications   Medication Sig Start Date End Date Taking? Authorizing Provider  benzonatate (TESSALON) 100 MG capsule Take 1 capsule (100 mg total) by mouth every 8 (eight) hours. 11/20/20  Yes Delton See, MD  cetirizine-pseudoephedrine (ZYRTEC-D) 5-120 MG tablet Take 1 tablet by mouth 2 (two) times daily. 10/21/19   Tommie Sams, DO  ferrous sulfate 134 MG TABS Take 1 tablet (134 mg total) by mouth daily with breakfast. 02/14/20 04/14/20  Shaune Pollack, MD  ipratropium (ATROVENT) 0.06 % nasal spray Place 2 sprays into both nostrils 4 (four) times daily as needed for rhinitis. 10/21/19   Tommie Sams, DO  Multiple Vitamin (MULTIVITAMIN) tablet Take 1 tablet by mouth daily.    [provider]  predniSONE (DELTASONE) 10 MG tablet Take 6 tablets p.o. on the first day and decrease by 1 tablet daily x5 days 10/21/20   Gareth Morgan    Family History Family History  Problem Relation Age of Onset  . Hypertension Mother   . Healthy Father     Social History Social History   Tobacco Use  . Smoking status: Never Smoker  . Smokeless tobacco: Never Used  Vaping Use  . Vaping Use: Never used  Substance Use Topics  . Alcohol use: Not Currently  . Drug use: Not Currently     Allergies   Other and Mango butter   Review of Systems Review of Systems  Constitutional: Negative for activity change, appetite change, chills, diaphoresis, fatigue and fever.  HENT: Positive for congestion and sinus pressure. Negative for ear pain, postnasal drip, rhinorrhea, sinus pain, sneezing and sore throat.   Eyes: Negative for pain.  Respiratory: Positive for cough and shortness of breath. Negative for choking, chest tightness, wheezing and stridor.   Cardiovascular: Positive for chest pain.  Gastrointestinal: Negative for abdominal pain.  Genitourinary: Negative for dysuria.  Skin: Negative for color change, pallor, rash and wound.  Neurological: Negative for dizziness, seizures, syncope, weakness, light-headedness, numbness and headaches.  All other systems reviewed and are negative.    Physical Exam Triage Vital Signs ED Triage Vitals  Enc Vitals Group     BP 11/20/20 1827 121/76     Pulse Rate  11/20/20 1827 76     Resp 11/20/20 1827 18     Temp 11/20/20 1827 98.5 F (36.9 C)     Temp Source 11/20/20 1827 Oral     SpO2 11/20/20 1827 100 %     Weight 11/20/20 1701 148 lb (67.1 kg)     Height 11/20/20 1701 5\' 9"  (1.753 m)     Head Circumference --      Peak Flow --      Pain Score 11/20/20 1701 6     Pain Loc --      Pain Edu? --      Excl. in [place]? --    No data found.  Updated Vital Signs BP 121/76 (BP Location: Right Arm)   Pulse 76   Temp 98.5 F (36.9 C) (Oral)   Resp 18   Ht 5\' 9"  (1.753 m)   Wt 67.1 kg   LMP 11/17/2020   SpO2 100%   BMI 21.86 kg/m   Visual Acuity Right Eye Distance:    Left Eye Distance:   Bilateral Distance:    Right Eye Near:   Left Eye Near:    Bilateral Near:     Physical Exam Vitals and nursing note reviewed.  Constitutional:      General: She is not in acute distress.    Appearance: Normal appearance. She is not ill-appearing or toxic-appearing.  HENT:     Head: Normocephalic and atraumatic.     Right Ear: Tympanic membrane normal.     Left Ear: Tympanic membrane normal.     Nose: Congestion present. No rhinorrhea.     Mouth/Throat:     Mouth: Mucous membranes are dry.     Pharynx: No oropharyngeal exudate or posterior oropharyngeal erythema.  Eyes:     Extraocular Movements: Extraocular movements intact.     Conjunctiva/sclera: Conjunctivae normal.  Cardiovascular:     Rate and Rhythm: Normal rate and regular rhythm.     Pulses: Normal pulses.     Heart sounds: No murmur heard. No friction rub. No gallop.   Pulmonary:     Effort: Pulmonary effort is normal. No respiratory distress.     Breath sounds: Normal breath sounds. No stridor. No wheezing, rhonchi or rales.  Musculoskeletal:     Cervical back: Normal range of motion and neck supple. No rigidity or tenderness.  Lymphadenopathy:     Cervical: Cervical adenopathy present.  Skin:    General: Skin is warm and dry.     Capillary Refill: Capillary refill takes less than 2 seconds.  Neurological:     General: No focal deficit present.     Mental Status: She is alert and oriented to person, place, and time.      UC Treatments / Results  Labs (all labs ordered are listed, but only abnormal results are displayed) Labs Reviewed  SARS CORONAVIRUS 2 (TAT 6-24 HRS)    EKG   Radiology No results found.  Procedures Procedures (including critical care time)  Medications Ordered in UC Medications - No data to display  Initial Impression / Assessment and Plan / UC Course  I have reviewed the triage vital signs and the nursing notes.  Pertinent labs & imaging results  that were available during my care of the patient were reviewed by me and considered in my medical decision making (see chart for details).  Clinical impression 5 days of cough congestion some sinus pressure.  Patient does have a history of Covid more than a year  ago.  She has not been vaccinated.  Treatment plan: 1.  The findings and treatment plan were discussed in detail with the patient.  Patient was in agreement. 2.  We will go ahead and test her for Covid today.  Send out to the hospital.  Will take 24 to 48 hours.  Of asked her to quarantine.  If she is positive someone will contact her.  If she is negative then she can return to work with a mask.  If she is positive then she needs to follow the current CDC guidelines. 3.  Gave her a work note and she will be out of work until she has a documented negative test. 4.  Given her cough we will go ahead and give her [employer]. 5.  Supportive care, over-the-counter meds as needed, Tylenol or Motrin for fever discomfort.  If she develops any worsening congestion or sinus pressure she can use Mucinex 1200 mg twice a day. 6.  Welcome to follow-up with [place] in the future.  Our office will be in touch with a positive test.      Final Clinical Impressions(s) / UC Diagnoses   Final diagnoses:  Viral URI with cough  Sinus pressure  Cough     Discharge Instructions     We will go ahead and test her for Covid today.  Send out to the hospital.  Will take 24 to 48 hours.  Of asked her to quarantine.  If she is positive someone will contact her.  If she is negative then she can return to work with a mask.  If she is positive then she needs to follow the current CDC guidelines. Gave her a work note and she will be out of work until she has a documented negative test. Given her cough we will go ahead and give her [employer]. Supportive care, over-the-counter meds as needed, Tylenol or Motrin for fever discomfort.  If she develops any worsening  congestion or sinus pressure she can use Mucinex 1200 mg twice a day. Welcome to follow-up with [place] in the future.  Our office will be in touch with a positive test.     ED Prescriptions    Medication Sig Dispense Auth. Provider   benzonatate (TESSALON) 100 MG capsule Take 1 capsule (100 mg total) by mouth every 8 (eight) hours. 21 capsule Delton See, MD     PDMP not reviewed this encounter.   Delton See, MD 11/20/20 Jerene Bears    Delton See, MD 11/20/20 2200

## 2020-11-20 NOTE — Discharge Instructions (Addendum)
We will go ahead and test her for Covid today.  Send out to the hospital.  Will take 24 to 48 hours.  Of asked her to quarantine.  If she is positive someone will contact her.  If she is negative then she can return to work with a mask.  If she is positive then she needs to follow the current CDC guidelines. Gave her a work note and she will be out of work until she has a documented negative test. Given her cough we will go ahead and give her [employer]. Supportive care, over-the-counter meds as needed, Tylenol or Motrin for fever discomfort.  If she develops any worsening congestion or sinus pressure she can use Mucinex 1200 mg twice a day. Welcome to follow-up with [place] in the future.  Our office will be in touch with a positive test.

## 2020-11-21 LAB — SARS CORONAVIRUS 2 (TAT 6-24 HRS): SARS Coronavirus 2: POSITIVE — AB

## 2021-11-21 IMAGING — CT CT HEAD W/O CM
3 series · 15 of 45 positions shown, 18 images · non-contrast
Comparison: No pertinent prior studies available for comparison.

CLINICAL DATA: Headache, acute, normal neuro exam. Additional
history provided: Patient presents with reported syncopal episode at
work on 02/10/2020 and again this morning, reportedly unconscious
for 5-10 minutes. The patient reports a right-sided headache, [DATE],
throbbing in quality,

EXAM:
CT HEAD WITHOUT CONTRAST
TECHNIQUE: Contiguous axial images were obtained from the base of the skull
through the vertex without intravenous contrast.

[Series 2: head wo · axial · 0.40mm/px · z∈[+130,+245]mm · 9 of 28 slices shown, 12 images]
[im 3/28  brain]
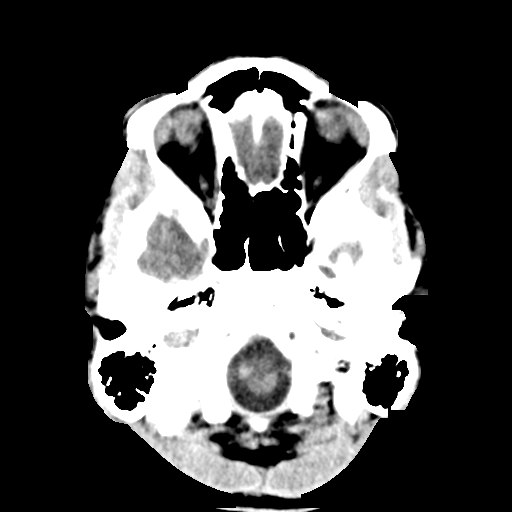
[im 3/28  bone]
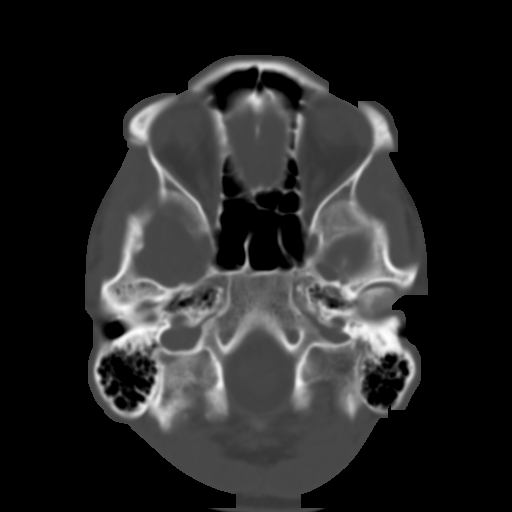
[im 6/28  brain]
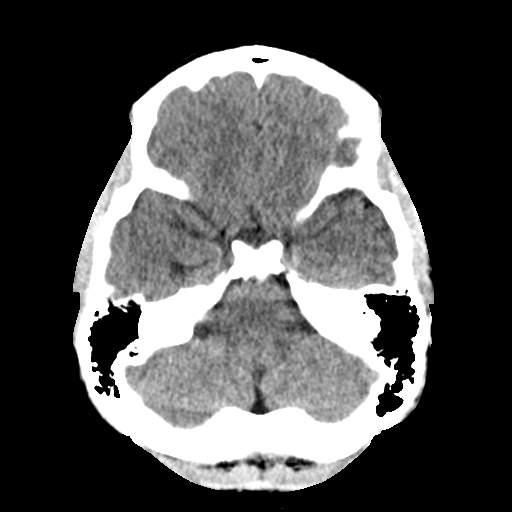
[im 9/28  brain]
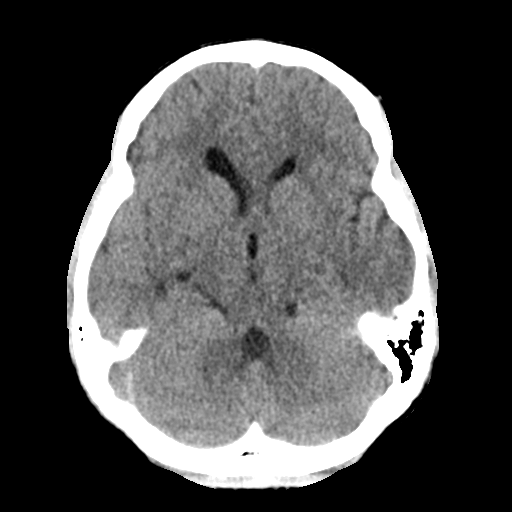
[im 12/28  brain]
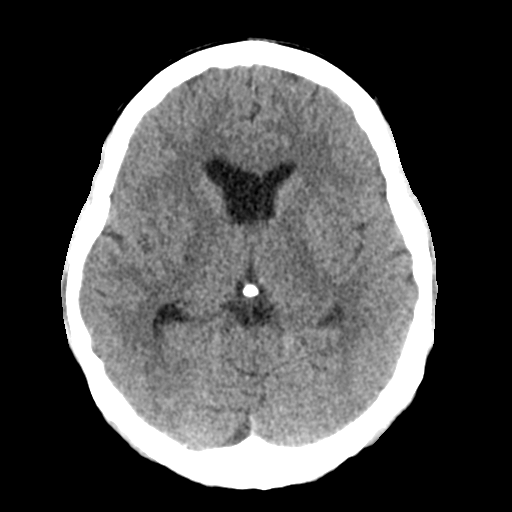
[im 15/28  brain]
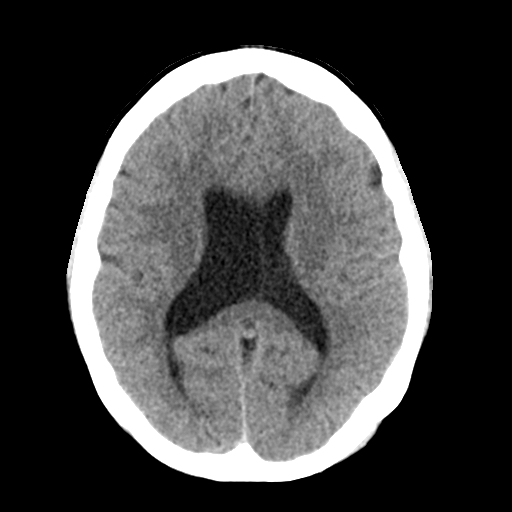
[im 15/28  bone]
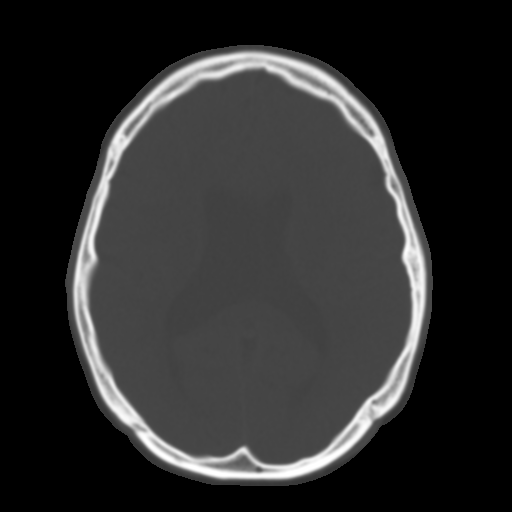
[im 17/28  brain]
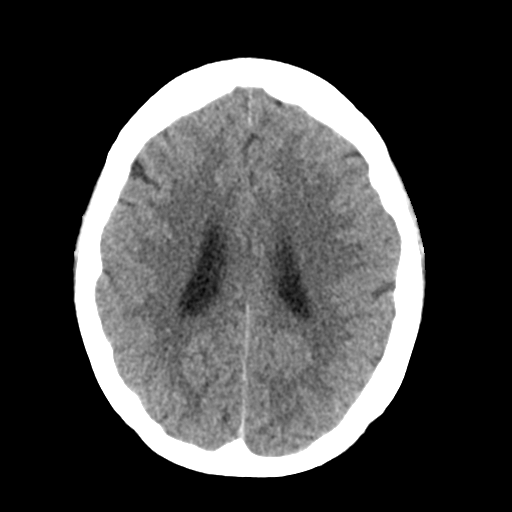
[im 20/28  brain]
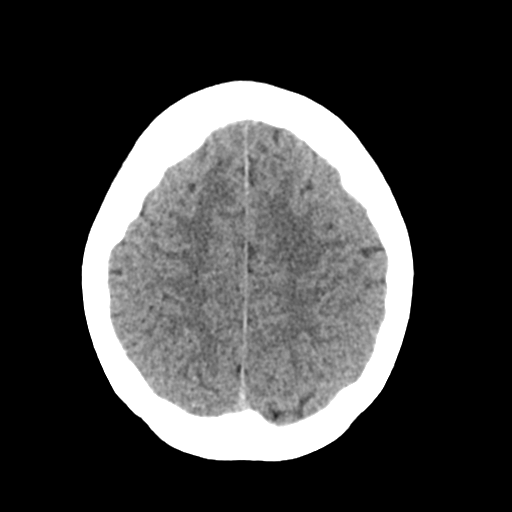
[im 23/28  brain]
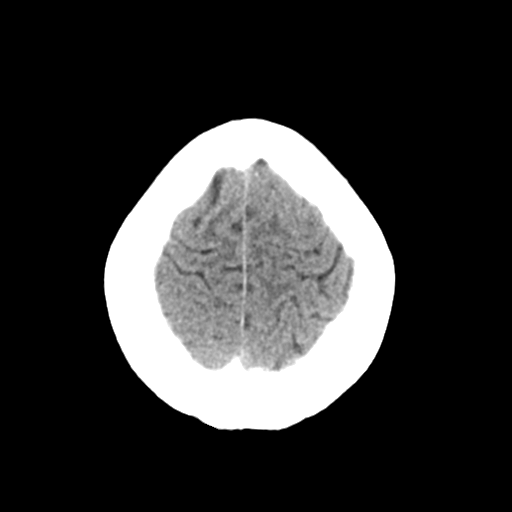
[im 26/28  brain]
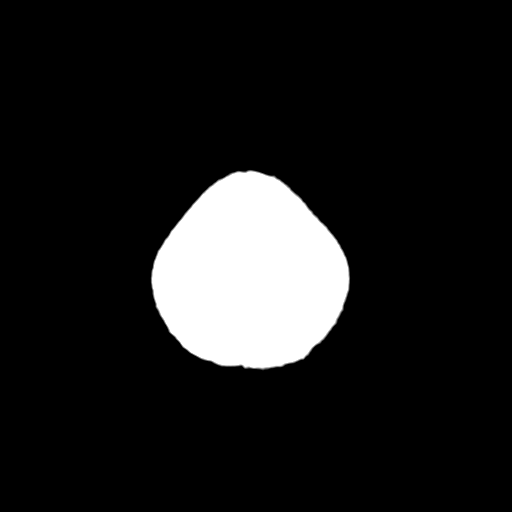
[im 26/28  bone]
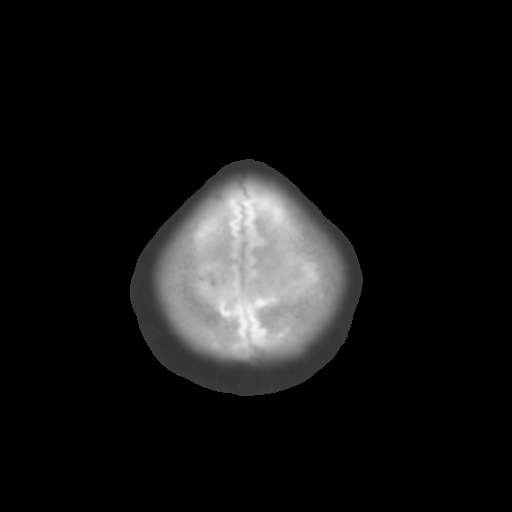

[Series 4: coronal soft tissue · coronal · 0.32mm/px · 3 of 63 slices shown]
[im 21/63  brain]
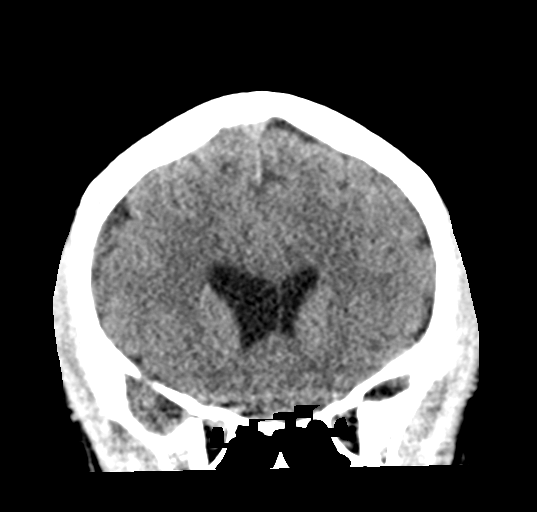
[im 28/63  brain]
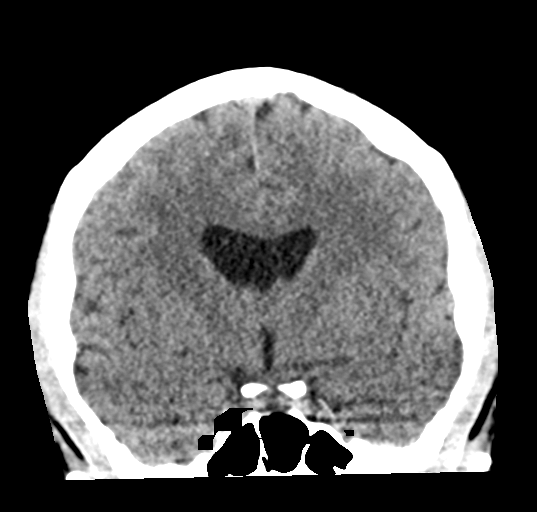
[im 35/63  brain]
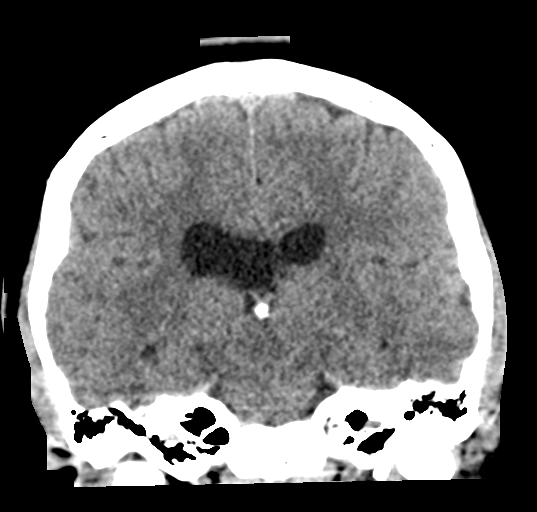

[Series 5: sagittal soft tissue · sagittal · 0.32mm/px · 3 of 56 slices shown]
[im 19/56  brain]
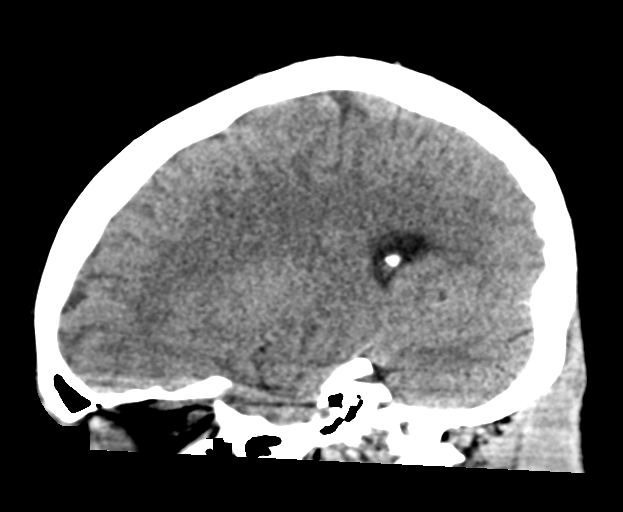
[im 28/56  brain]
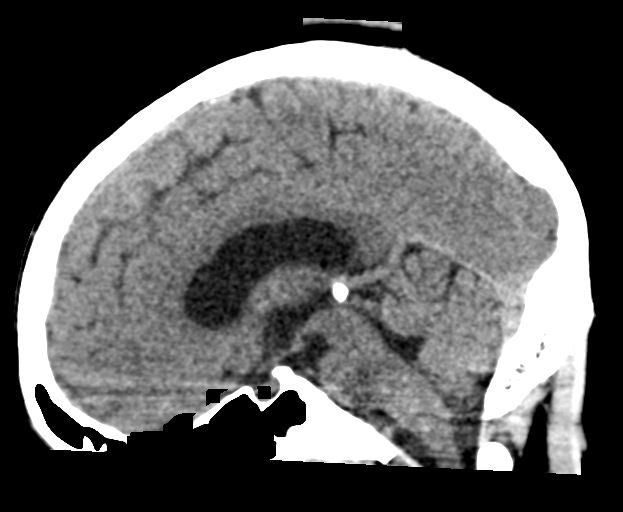
[im 37/56  brain]
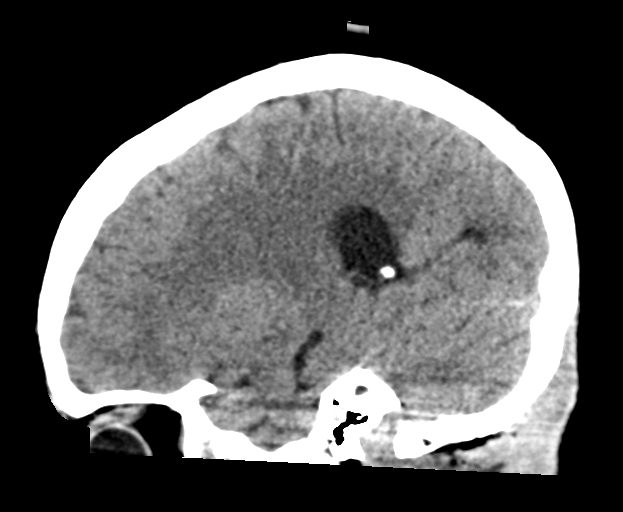

[15 of 45 positions shown; findings below may reference images not displayed]

FINDINGS: Brain: There is no evidence of acute intracranial hemorrhage,
intracranial mass, midline shift or extra-axial fluid collection.No
demarcated cortical infarction. Asymmetry of the lateral ventricles
with the right slightly more prominent than the left, likely
developmental.

Vascular: No hyperdense vessel.

Skull: Normal. Negative for fracture or focal lesion.

Sinuses/Orbits: Visualized orbits demonstrate no acute abnormality.
No significant paranasal sinus disease or mastoid effusion at the
imaged levels.
IMPRESSION: No evidence of acute intracranial abnormality.

Asymmetry of the lateral ventricles as described and likely
developmental.

## 2023-01-27 ENCOUNTER — Ambulatory Visit: Payer: Managed Care, Other (non HMO)

## 2023-01-29 ENCOUNTER — Ambulatory Visit: Payer: Managed Care, Other (non HMO)
